# Patient Record
Sex: Male | Born: 1985 | Race: Asian | Hispanic: No | Marital: Single | Smoking: Current every day smoker
Health system: Southern US, Community
[De-identification: ages and names within clinical notes are randomized; demographics above are authoritative.]

## PROBLEM LIST (undated history)

## (undated) DIAGNOSIS — M25559 Pain in unspecified hip: Secondary | ICD-10-CM

---

## 2015-03-02 ENCOUNTER — Other Ambulatory Visit: Payer: Self-pay | Admitting: Orthopedic Surgery

## 2015-03-02 DIAGNOSIS — M87051 Idiopathic aseptic necrosis of right femur: Secondary | ICD-10-CM

## 2015-03-08 ENCOUNTER — Ambulatory Visit: Admission: RE | Admit: 2015-03-08 | Payer: BLUE CROSS/BLUE SHIELD | Source: Ambulatory Visit

## 2015-03-22 ENCOUNTER — Encounter
Admission: RE | Admit: 2015-03-22 | Discharge: 2015-03-22 | Disposition: A | Discharge status: Home / Self Care | Payer: BLUE CROSS/BLUE SHIELD | Source: Ambulatory Visit | Attending: Orthopedic Surgery | Admitting: Orthopedic Surgery

## 2015-03-22 DIAGNOSIS — Z01812 Encounter for preprocedural laboratory examination: Secondary | ICD-10-CM | POA: Insufficient documentation

## 2015-03-22 DIAGNOSIS — Z01810 Encounter for preprocedural cardiovascular examination: Secondary | ICD-10-CM | POA: Insufficient documentation

## 2015-03-22 HISTORY — DX: Pain in unspecified hip: M25.559

## 2015-03-22 LAB — URINALYSIS COMPLETE WITH MICROSCOPIC (ARMC ONLY)
Bacteria, UA: NONE SEEN
Bilirubin Urine: NEGATIVE
Glucose, UA: NEGATIVE mg/dL
Hgb urine dipstick: NEGATIVE
KETONES UR: NEGATIVE mg/dL
Leukocytes, UA: NEGATIVE
Nitrite: NEGATIVE
PH: 6 (ref 5.0–8.0)
Protein, ur: NEGATIVE mg/dL
RBC / HPF: NONE SEEN RBC/hpf (ref 0–5)
SQUAMOUS EPITHELIAL / LPF: NONE SEEN
Specific Gravity, Urine: 1.005 (ref 1.005–1.030)

## 2015-03-22 LAB — CBC
HCT: 50.2 % (ref 40.0–52.0)
Hemoglobin: 16.8 g/dL (ref 13.0–18.0)
MCH: 32.9 pg (ref 26.0–34.0)
MCHC: 33.5 g/dL (ref 32.0–36.0)
MCV: 98.2 fL (ref 80.0–100.0)
Platelets: 209 10*3/uL (ref 150–440)
RBC: 5.11 MIL/uL (ref 4.40–5.90)
RDW: 14.2 % (ref 11.5–14.5)
WBC: 9.1 10*3/uL (ref 3.8–10.6)

## 2015-03-22 LAB — BASIC METABOLIC PANEL
ANION GAP: 8 (ref 5–15)
BUN: 9 mg/dL (ref 6–20)
CALCIUM: 9.7 mg/dL (ref 8.9–10.3)
CO2: 27 mmol/L (ref 22–32)
Chloride: 103 mmol/L (ref 101–111)
Creatinine, Ser: 0.71 mg/dL (ref 0.61–1.24)
GLUCOSE: 96 mg/dL (ref 65–99)
Potassium: 4.2 mmol/L (ref 3.5–5.1)
Sodium: 138 mmol/L (ref 135–145)

## 2015-03-22 LAB — PROTIME-INR
INR: 0.89
Prothrombin Time: 12.2 seconds (ref 11.4–15.0)

## 2015-03-22 LAB — TYPE AND SCREEN
ABO/RH(D): B POS
Antibody Screen: NEGATIVE

## 2015-03-22 LAB — APTT: aPTT: 27 seconds (ref 24–36)

## 2015-03-22 LAB — SEDIMENTATION RATE: Sed Rate: 3 mm/hr (ref 0–15)

## 2015-03-22 LAB — SURGICAL PCR SCREEN
MRSA, PCR: NEGATIVE
Staphylococcus aureus: POSITIVE — AB

## 2015-03-22 NOTE — OR Nursing (Signed)
DR ADAMS REQUEST FOR CLEARANCE FOR HIGH BP CALLED AND FAXED TO DR [facility] OFFICE. PATIENT AWARE

## 2015-03-22 NOTE — Patient Instructions (Signed)
  Your procedure is scheduled on:03/29/15 Report to Day Surgery. [facility] SECOND FLOOR To find out your arrival time please call [phone] between 1PM - 3PM on 03/28/15  Remember: Instructions that are not followed completely may result in serious medical risk, up to and including death, or upon the discretion of your surgeon and anesthesiologist your surgery may need to be rescheduled.    __X__ 1. Do not eat food or drink liquids after midnight. No gum chewing or hard candies.     __X__ 2. No Alcohol for 24 hours before or after surgery.   ____ 3. Bring all medications with you on the day of surgery if instructed.    __X__ 4. Notify your doctor if there is any change in your medical condition     (cold, fever, infections).     Do not wear jewelry, make-up, hairpins, clips or nail polish.  Do not wear lotions, powders, or perfumes. You may wear deodorant.  Do not shave 48 hours prior to surgery. Men may shave face and neck.  Do not bring valuables to the hospital.    [facility] is not responsible for any belongings or valuables.               Contacts, dentures or bridgework may not be worn into surgery.  Leave your suitcase in the car. After surgery it may be brought to your room.  For patients admitted to the hospital, discharge time is determined by your                treatment team.   Patients discharged the day of surgery will not be allowed to drive home.   Please read over the following fact sheets that you were given:   Surgical Site Infection Prevention   ____ Take these medicines the morning of surgery with A SIP OF WATER:    1.   2.   3.   4.  5.  6.  ____ Fleet Enema (as directed)   _X___ Use CHG Soap as directed  ____ Use inhalers on the day of surgery  ____ Stop metformin 2 days prior to surgery    ____ Take 1/2 of usual insulin dose the night before surgery and none on the morning of surgery.   ____ Stop Coumadin/Plavix/aspirin on  ____ Stop  Anti-inflammatories on  ____ Stop supplements until after surgery.    ____ Bring C-Pap to the hospital.

## 2015-03-23 LAB — ABO/RH: ABO/RH(D): B POS

## 2015-03-27 NOTE — OR Nursing (Signed)
DR Burnadette Pop WOULD NOT CLEAR DUE TO UNCONTROLLED BP.[facility] AT [facility] AWARE

## 2015-03-27 NOTE — OR Nursing (Signed)
Positive staph called to Dr Rosita Kea office

## 2015-03-29 ENCOUNTER — Inpatient Hospital Stay
Admission: RE | Admit: 2015-03-29 | Payer: BLUE CROSS/BLUE SHIELD | Source: Ambulatory Visit | Admitting: Orthopedic Surgery

## 2015-03-29 ENCOUNTER — Encounter: Admission: RE | Payer: Self-pay | Source: Ambulatory Visit

## 2015-03-29 SURGERY — ARTHROPLASTY, HIP, TOTAL, ANTERIOR APPROACH
Anesthesia: Choice | Laterality: Right

## 2015-04-03 ENCOUNTER — Ambulatory Visit: Payer: BLUE CROSS/BLUE SHIELD

## 2015-04-03 ENCOUNTER — Ambulatory Visit: Payer: BLUE CROSS/BLUE SHIELD | Admitting: Anesthesiology

## 2015-04-03 ENCOUNTER — Encounter: Payer: Self-pay | Admitting: *Deleted

## 2015-04-03 ENCOUNTER — Inpatient Hospital Stay: Payer: BLUE CROSS/BLUE SHIELD

## 2015-04-03 ENCOUNTER — Encounter
Admission: RE | Disposition: A | Discharge status: Home / Self Care | Payer: Self-pay | Source: Ambulatory Visit | Attending: Orthopedic Surgery

## 2015-04-03 ENCOUNTER — Inpatient Hospital Stay
Admission: RE | Admit: 2015-04-03 | Discharge: 2015-04-06 | DRG: 470 | Disposition: A | Discharge status: Home / Self Care | Payer: BLUE CROSS/BLUE SHIELD | Source: Ambulatory Visit | Attending: Orthopedic Surgery | Admitting: Orthopedic Surgery

## 2015-04-03 DIAGNOSIS — Z419 Encounter for procedure for purposes other than remedying health state, unspecified: Secondary | ICD-10-CM

## 2015-04-03 DIAGNOSIS — G8918 Other acute postprocedural pain: Secondary | ICD-10-CM

## 2015-04-03 DIAGNOSIS — M879 Osteonecrosis, unspecified: Principal | ICD-10-CM | POA: Diagnosis present

## 2015-04-03 DIAGNOSIS — Z79899 Other long term (current) drug therapy: Secondary | ICD-10-CM

## 2015-04-03 DIAGNOSIS — Z79891 Long term (current) use of opiate analgesic: Secondary | ICD-10-CM

## 2015-04-03 DIAGNOSIS — Z7982 Long term (current) use of aspirin: Secondary | ICD-10-CM

## 2015-04-03 DIAGNOSIS — I10 Essential (primary) hypertension: Secondary | ICD-10-CM | POA: Diagnosis present

## 2015-04-03 HISTORY — PX: TOTAL HIP ARTHROPLASTY: SHX124

## 2015-04-03 LAB — CBC
HEMATOCRIT: 47.1 % (ref 40.0–52.0)
HEMOGLOBIN: 15.4 g/dL (ref 13.0–18.0)
MCH: 31.9 pg (ref 26.0–34.0)
MCHC: 32.7 g/dL (ref 32.0–36.0)
MCV: 97.5 fL (ref 80.0–100.0)
Platelets: 238 10*3/uL (ref 150–440)
RBC: 4.83 MIL/uL (ref 4.40–5.90)
RDW: 13.7 % (ref 11.5–14.5)
WBC: 13.9 10*3/uL — AB (ref 3.8–10.6)

## 2015-04-03 LAB — CREATININE, SERUM
Creatinine, Ser: 0.83 mg/dL (ref 0.61–1.24)
GFR calc non Af Amer: >60 mL/min (ref >60)

## 2015-04-03 SURGERY — ARTHROPLASTY, HIP, TOTAL, ANTERIOR APPROACH
Anesthesia: General | Site: Hip | Laterality: Right | Wound class: Clean

## 2015-04-03 MED ORDER — METOCLOPRAMIDE HCL 5 MG/ML IJ SOLN: 5.0000 mg | Freq: Three times a day (TID) | INTRAMUSCULAR | Status: DC | PRN

## 2015-04-03 MED ORDER — LACTATED RINGERS IV SOLN
INTRAVENOUS | Status: DC
  Administered 2015-04-03 (×2): via INTRAVENOUS

## 2015-04-03 MED ORDER — FAMOTIDINE 20 MG PO TABS
ORAL_TABLET | ORAL | Status: AC
  Filled 2015-04-03: qty 1

## 2015-04-03 MED ORDER — CLINDAMYCIN PHOSPHATE 600 MG/50ML IV SOLN
600.0000 mg | Freq: Four times a day (QID) | INTRAVENOUS | Status: AC
  Administered 2015-04-03 (×2): 600 mg via INTRAVENOUS
  Filled 2015-04-03 (×2): qty 50

## 2015-04-03 MED ORDER — DOCUSATE SODIUM 100 MG PO CAPS
100.0000 mg | ORAL_CAPSULE | Freq: Two times a day (BID) | ORAL | Status: DC
  Administered 2015-04-03 – 2015-04-06 (×6): 100 mg via ORAL
  Filled 2015-04-03 (×7): qty 1

## 2015-04-03 MED ORDER — MORPHINE SULFATE (PF) 2 MG/ML IV SOLN
2.0000 mg | INTRAVENOUS | Status: DC | PRN
  Administered 2015-04-03: 2 mg via INTRAVENOUS
  Filled 2015-04-03: qty 1

## 2015-04-03 MED ORDER — ONDANSETRON HCL 4 MG/2ML IJ SOLN
INTRAMUSCULAR | Status: DC | PRN
  Administered 2015-04-03: 4 mg via INTRAVENOUS

## 2015-04-03 MED ORDER — HYDROMORPHONE HCL 1 MG/ML IJ SOLN
0.2500 mg | INTRAMUSCULAR | Status: DC | PRN
  Administered 2015-04-03 (×8): 0.25 mg via INTRAVENOUS

## 2015-04-03 MED ORDER — SUCCINYLCHOLINE CHLORIDE 20 MG/ML IJ SOLN
INTRAMUSCULAR | Status: DC | PRN
  Administered 2015-04-03: 120 mg via INTRAVENOUS

## 2015-04-03 MED ORDER — SODIUM CHLORIDE 0.9 % IV SOLN
INTRAVENOUS | Status: DC
  Administered 2015-04-03 – 2015-04-04 (×2): via INTRAVENOUS

## 2015-04-03 MED ORDER — HYDROMORPHONE HCL 1 MG/ML IJ SOLN
0.2500 mg | INTRAMUSCULAR | Status: DC | PRN
  Administered 2015-04-03 (×3): 0.5 mg via INTRAVENOUS

## 2015-04-03 MED ORDER — TRANEXAMIC ACID 1000 MG/10ML IV SOLN
1000.0000 mg | INTRAVENOUS | Status: AC
  Administered 2015-04-03: 1000 mg via INTRAVENOUS
  Filled 2015-04-03: qty 10

## 2015-04-03 MED ORDER — MAGNESIUM HYDROXIDE 400 MG/5ML PO SUSP: 30.0000 mL | Freq: Every day | ORAL | Status: DC | PRN

## 2015-04-03 MED ORDER — ACETAMINOPHEN 325 MG PO TABS: 650.0000 mg | ORAL_TABLET | Freq: Four times a day (QID) | ORAL | Status: DC | PRN

## 2015-04-03 MED ORDER — BUPIVACAINE-EPINEPHRINE 0.25% -1:200000 IJ SOLN
INTRAMUSCULAR | Status: DC | PRN
  Administered 2015-04-03: 30 mL

## 2015-04-03 MED ORDER — METOCLOPRAMIDE HCL 5 MG PO TABS: 5.0000 mg | ORAL_TABLET | Freq: Three times a day (TID) | ORAL | Status: DC | PRN

## 2015-04-03 MED ORDER — CLINDAMYCIN PHOSPHATE 900 MG/50ML IV SOLN
INTRAVENOUS | Status: AC
  Administered 2015-04-03: 900 mg via INTRAVENOUS
  Filled 2015-04-03: qty 50

## 2015-04-03 MED ORDER — ALUM & MAG HYDROXIDE-SIMETH 200-200-20 MG/5ML PO SUSP: 30.0000 mL | ORAL | Status: DC | PRN

## 2015-04-03 MED ORDER — METHOCARBAMOL 500 MG PO TABS
500.0000 mg | ORAL_TABLET | Freq: Four times a day (QID) | ORAL | Status: DC | PRN
  Administered 2015-04-05: 500 mg via ORAL
  Filled 2015-04-03: qty 1

## 2015-04-03 MED ORDER — ACETAMINOPHEN 10 MG/ML IV SOLN
INTRAVENOUS | Status: AC
Start: 2015-04-03 — End: 2015-04-03
  Filled 2015-04-03: qty 100

## 2015-04-03 MED ORDER — BISACODYL 10 MG RE SUPP: 10.0000 mg | Freq: Every day | RECTAL | Status: DC | PRN

## 2015-04-03 MED ORDER — HYDROMORPHONE HCL 1 MG/ML IJ SOLN
INTRAMUSCULAR | Status: AC
  Administered 2015-04-03: 0.25 mg via INTRAVENOUS
  Filled 2015-04-03: qty 1

## 2015-04-03 MED ORDER — HYDROMORPHONE HCL 1 MG/ML IJ SOLN
INTRAMUSCULAR | Status: AC
  Administered 2015-04-03: 0.5 mg via INTRAVENOUS
  Filled 2015-04-03: qty 1

## 2015-04-03 MED ORDER — NEOMYCIN-POLYMYXIN B GU 40-200000 IR SOLN
Status: DC | PRN
  Administered 2015-04-03: 4 mL

## 2015-04-03 MED ORDER — METHOCARBAMOL 1000 MG/10ML IJ SOLN: 500.0000 mg | Freq: Four times a day (QID) | INTRAMUSCULAR | Status: DC | PRN

## 2015-04-03 MED ORDER — LIDOCAINE HCL (CARDIAC) 20 MG/ML IV SOLN
INTRAVENOUS | Status: DC | PRN
  Administered 2015-04-03: 100 mg via INTRAVENOUS

## 2015-04-03 MED ORDER — DEXAMETHASONE SODIUM PHOSPHATE 4 MG/ML IJ SOLN
INTRAMUSCULAR | Status: DC | PRN
  Administered 2015-04-03: 10 mg via INTRAVENOUS

## 2015-04-03 MED ORDER — NEOMYCIN-POLYMYXIN B GU 40-200000 IR SOLN
Status: AC
  Filled 2015-04-03: qty 20

## 2015-04-03 MED ORDER — FENTANYL CITRATE (PF) 100 MCG/2ML IJ SOLN
25.0000 ug | INTRAMUSCULAR | Status: DC | PRN
  Administered 2015-04-03 (×4): 25 ug via INTRAVENOUS

## 2015-04-03 MED ORDER — LISINOPRIL 10 MG PO TABS
10.0000 mg | ORAL_TABLET | Freq: Every day | ORAL | Status: DC
  Administered 2015-04-04 – 2015-04-06 (×3): 10 mg via ORAL
  Filled 2015-04-03 (×3): qty 1

## 2015-04-03 MED ORDER — CLINDAMYCIN PHOSPHATE 900 MG/50ML IV SOLN: 900.0000 mg | INTRAVENOUS | Status: DC

## 2015-04-03 MED ORDER — ONDANSETRON HCL 4 MG PO TABS: 4.0000 mg | ORAL_TABLET | Freq: Four times a day (QID) | ORAL | Status: DC | PRN

## 2015-04-03 MED ORDER — CHLORHEXIDINE GLUCONATE 4 % EX LIQD: 60.0000 mL | Freq: Once | CUTANEOUS | Status: DC

## 2015-04-03 MED ORDER — HYDROCHLOROTHIAZIDE 12.5 MG PO CAPS
12.5000 mg | ORAL_CAPSULE | Freq: Every day | ORAL | Status: DC
  Administered 2015-04-04 – 2015-04-06 (×3): 12.5 mg via ORAL
  Filled 2015-04-03 (×3): qty 1

## 2015-04-03 MED ORDER — ACETAMINOPHEN 10 MG/ML IV SOLN
INTRAVENOUS | Status: DC | PRN
  Administered 2015-04-03: 1000 mg via INTRAVENOUS

## 2015-04-03 MED ORDER — ACETAMINOPHEN 650 MG RE SUPP: 650.0000 mg | Freq: Four times a day (QID) | RECTAL | Status: DC | PRN

## 2015-04-03 MED ORDER — ONDANSETRON HCL 4 MG/2ML IJ SOLN: 4.0000 mg | Freq: Once | INTRAMUSCULAR | Status: DC | PRN

## 2015-04-03 MED ORDER — PHENOL 1.4 % MT LIQD: 1.0000 | OROMUCOSAL | Status: DC | PRN

## 2015-04-03 MED ORDER — MAGNESIUM CITRATE PO SOLN: 1.0000 | Freq: Once | ORAL | Status: DC | PRN

## 2015-04-03 MED ORDER — DIPHENHYDRAMINE HCL 12.5 MG/5ML PO ELIX: 12.5000 mg | ORAL_SOLUTION | ORAL | Status: DC | PRN

## 2015-04-03 MED ORDER — HYDROMORPHONE HCL 1 MG/ML IJ SOLN
0.5000 mg | INTRAMUSCULAR | Status: DC | PRN
  Administered 2015-04-03 (×2): 0.5 mg via INTRAVENOUS
  Filled 2015-04-03 (×2): qty 1

## 2015-04-03 MED ORDER — KETOROLAC TROMETHAMINE 30 MG/ML IJ SOLN
INTRAMUSCULAR | Status: DC | PRN
  Administered 2015-04-03: 30 mg via INTRAVENOUS

## 2015-04-03 MED ORDER — FENTANYL CITRATE (PF) 100 MCG/2ML IJ SOLN
INTRAMUSCULAR | Status: AC
Start: 2015-04-03 — End: 2015-04-03
  Administered 2015-04-03: 25 ug via INTRAVENOUS
  Filled 2015-04-03: qty 2

## 2015-04-03 MED ORDER — PROPOFOL 10 MG/ML IV BOLUS
INTRAVENOUS | Status: DC | PRN
  Administered 2015-04-03: 200 mg via INTRAVENOUS

## 2015-04-03 MED ORDER — MENTHOL 3 MG MT LOZG: 1.0000 | LOZENGE | OROMUCOSAL | Status: DC | PRN

## 2015-04-03 MED ORDER — LABETALOL HCL 5 MG/ML IV SOLN
INTRAVENOUS | Status: AC
  Administered 2015-04-03: 5 mg via INTRAVENOUS
  Filled 2015-04-03: qty 4

## 2015-04-03 MED ORDER — ONDANSETRON HCL 4 MG/2ML IJ SOLN
4.0000 mg | Freq: Four times a day (QID) | INTRAMUSCULAR | Status: DC | PRN
  Administered 2015-04-04: 4 mg via INTRAVENOUS
  Filled 2015-04-03: qty 2

## 2015-04-03 MED ORDER — MIDAZOLAM HCL 2 MG/2ML IJ SOLN
INTRAMUSCULAR | Status: DC | PRN
  Administered 2015-04-03: 2 mg via INTRAVENOUS

## 2015-04-03 MED ORDER — ROCURONIUM BROMIDE 100 MG/10ML IV SOLN
INTRAVENOUS | Status: DC | PRN
  Administered 2015-04-03: 40 mg via INTRAVENOUS
  Administered 2015-04-03: 10 mg via INTRAVENOUS

## 2015-04-03 MED ORDER — OXYCODONE HCL 5 MG PO TABS
5.0000 mg | ORAL_TABLET | ORAL | Status: DC | PRN
  Administered 2015-04-03 (×2): 5 mg via ORAL
  Administered 2015-04-03 – 2015-04-06 (×14): 10 mg via ORAL
  Filled 2015-04-03: qty 1
  Filled 2015-04-03: qty 10
  Filled 2015-04-03 (×10): qty 2
  Filled 2015-04-03: qty 1
  Filled 2015-04-03 (×3): qty 2

## 2015-04-03 MED ORDER — FAMOTIDINE 20 MG PO TABS
20.0000 mg | ORAL_TABLET | Freq: Once | ORAL | Status: AC
  Administered 2015-04-03: 20 mg via ORAL

## 2015-04-03 MED ORDER — BUPIVACAINE-EPINEPHRINE (PF) 0.25% -1:200000 IJ SOLN
INTRAMUSCULAR | Status: AC
  Filled 2015-04-03: qty 30

## 2015-04-03 MED ORDER — HYDROMORPHONE HCL 1 MG/ML IJ SOLN
1.0000 mg | INTRAMUSCULAR | Status: DC | PRN
  Administered 2015-04-03 – 2015-04-06 (×12): 1 mg via INTRAVENOUS
  Filled 2015-04-03 (×12): qty 1

## 2015-04-03 MED ORDER — SUGAMMADEX SODIUM 200 MG/2ML IV SOLN
INTRAVENOUS | Status: DC | PRN
  Administered 2015-04-03: 177 mg via INTRAVENOUS

## 2015-04-03 MED ORDER — ZOLPIDEM TARTRATE 5 MG PO TABS: 5.0000 mg | ORAL_TABLET | Freq: Every evening | ORAL | Status: DC | PRN

## 2015-04-03 MED ORDER — LISINOPRIL-HYDROCHLOROTHIAZIDE 10-12.5 MG PO TABS: 1.0000 | ORAL_TABLET | Freq: Every day | ORAL | Status: DC

## 2015-04-03 MED ORDER — ENOXAPARIN SODIUM 40 MG/0.4ML ~~LOC~~ SOLN
40.0000 mg | SUBCUTANEOUS | Status: DC
  Administered 2015-04-04 – 2015-04-06 (×3): 40 mg via SUBCUTANEOUS
  Filled 2015-04-03 (×3): qty 0.4

## 2015-04-03 MED ORDER — FENTANYL CITRATE (PF) 100 MCG/2ML IJ SOLN
INTRAMUSCULAR | Status: DC | PRN
  Administered 2015-04-03: 100 ug via INTRAVENOUS
  Administered 2015-04-03 (×2): 50 ug via INTRAVENOUS
  Administered 2015-04-03: 100 ug via INTRAVENOUS

## 2015-04-03 MED ORDER — LABETALOL HCL 5 MG/ML IV SOLN
5.0000 mg | INTRAVENOUS | Status: DC | PRN
  Administered 2015-04-03 (×2): 5 mg via INTRAVENOUS

## 2015-04-03 SURGICAL SUPPLY — 43 items
BLADE SAW 1/2 (BLADE) ×3 IMPLANT
BNDG COHESIVE 6X5 TAN STRL LF (GAUZE/BANDAGES/DRESSINGS) ×6 IMPLANT
CANISTER SUCT 1200ML W/VALVE (MISCELLANEOUS) ×3 IMPLANT
CAPT HIP TOTAL 3 ×3 IMPLANT
CATH FOL LEG HOLDER (MISCELLANEOUS) ×3 IMPLANT
CATH TRAY METER 16FR LF (MISCELLANEOUS) ×3 IMPLANT
CHLORAPREP W/TINT 26ML (MISCELLANEOUS) ×3 IMPLANT
DRAPE C-ARM XRAY 36X54 (DRAPES) ×3 IMPLANT
DRAPE INCISE IOBAN 66X60 STRL (DRAPES) IMPLANT
DRAPE POUCH INSTRU U-SHP 10X18 (DRAPES) ×3 IMPLANT
DRAPE SHEET LG 3/4 BI-LAMINATE (DRAPES) ×9 IMPLANT
DRAPE TABLE BACK 80X90 (DRAPES) ×3 IMPLANT
ELECT BLADE 6.5 EXT (BLADE) ×3 IMPLANT
GAUZE SPONGE 4X4 12PLY STRL (GAUZE/BANDAGES/DRESSINGS) ×3 IMPLANT
GLOVE BIOGEL PI IND STRL 9 (GLOVE) ×1 IMPLANT
GLOVE BIOGEL PI INDICATOR 9 (GLOVE) ×2
GLOVE SURG ORTHO 9.0 STRL STRW (GLOVE) ×3 IMPLANT
GOWN SPECIALTY ULTRA XL (MISCELLANEOUS) ×3 IMPLANT
GOWN STRL REUS W/ TWL LRG LVL3 (GOWN DISPOSABLE) ×1 IMPLANT
GOWN STRL REUS W/TWL LRG LVL3 (GOWN DISPOSABLE) ×2
HEMOVAC 400CC 10FR (MISCELLANEOUS) ×3 IMPLANT
HOOD PEEL AWAY FACE SHEILD DIS (HOOD) ×3 IMPLANT
MAT BLUE FLOOR 46X72 FLO (MISCELLANEOUS) ×3 IMPLANT
NDL SAFETY 18GX1.5 (NEEDLE) ×3 IMPLANT
NEEDLE SPNL 18GX3.5 QUINCKE PK (NEEDLE) ×3 IMPLANT
NS IRRIG 1000ML POUR BTL (IV SOLUTION) ×3 IMPLANT
PACK HIP COMPR (MISCELLANEOUS) ×3 IMPLANT
SOL PREP PVP 2OZ (MISCELLANEOUS) ×3
SOLUTION PREP PVP 2OZ (MISCELLANEOUS) ×1 IMPLANT
STAPLER SKIN PROX 35W (STAPLE) ×3 IMPLANT
STRAP SAFETY BODY (MISCELLANEOUS) ×3 IMPLANT
SUT DVC 2 QUILL PDO  T11 36X36 (SUTURE) ×2
SUT DVC 2 QUILL PDO T11 36X36 (SUTURE) ×1 IMPLANT
SUT DVC QUILL MONODERM 30X30 (SUTURE) ×3 IMPLANT
SUT ETHIBOND NAB CT1 #1 30IN (SUTURE) ×3 IMPLANT
SUT SILK 0 (SUTURE) ×2
SUT SILK 0 30XBRD TIE 6 (SUTURE) ×1 IMPLANT
SUT VIC AB 1 CT1 36 (SUTURE) ×3 IMPLANT
SYR 20CC LL (SYRINGE) ×3 IMPLANT
SYR 30ML LL (SYRINGE) ×3 IMPLANT
TAPE MICROFOAM 4IN (TAPE) ×3 IMPLANT
TUBE KAMVAC SUCTION (TUBING) ×3 IMPLANT
WATER STERILE IRR 1000ML POUR (IV SOLUTION) IMPLANT

## 2015-04-03 NOTE — Progress Notes (Signed)
Dilaudid changed to  q1h prn and oxycodone 5-10mg  q4h prn for moderate pain.

## 2015-04-03 NOTE — H&P (Signed)
Reviewed paper H+P, will be scanned into chart. No changes noted.  

## 2015-04-03 NOTE — Op Note (Signed)
04/03/2015  9:42 AM  PATIENT:  Edward Hampton  29 y.o. male  PRE-OPERATIVE DIAGNOSIS:  RIGHT AVASCULAR NECROSIS  POST-OPERATIVE DIAGNOSIS:  RIGHT AVASCULAR NECROSIS  PROCEDURE:  Procedure(s): TOTAL HIP ARTHROPLASTY ANTERIOR APPROACH (Right)  SURGEON: Leitha Schuller, MD  ASSISTANTS: None  ANESTHESIA:   general  EBL:  Total I/O In: 1100 [I.V.:1100] Out: 450 [Urine:300; Blood:150]  BLOOD ADMINISTERED:none  DRAINS: none   LOCAL MEDICATIONS USED:  MARCAINE     SPECIMEN:  Source of Specimen:  Femoral head  DISPOSITION OF SPECIMEN:  PATHOLOGY  COUNTS:  YES  TOURNIQUET:  * No tourniquets in log *  IMPLANTS: Medacta AMIS 3 standard stem, 58 mm Mpact DM cup and liner, M 28 mm head  DICTATION: .Dragon Dictation   The patient was brought to the operating room and after general anesthesia was obtained patient was placed on the operative table with the ipsilateral foot into the Medacta attachment, contralateral leg on a well-padded table. C-arm was brought in and preop template x-ray taken. After prepping and draping in usual sterile fashion appropriate patient identification and timeout procedures were completed. Anterior approach to the hip was obtained and centered over the greater trochanter and TFL muscle. The subcutaneous tissue was incised hemostasis being achieved by electrocautery. TFL fascia was incised and the muscle retracted laterally deep retractor placed. The lateral femoral circumflex vessels were identified and ligated. The anterior capsule was exposed and a capsulotomy performed. The neck was identified and a femoral neck cut carried out with a saw. The head was removed without difficulty and showed sclerotic femoral head and apparent avascular necrosis. Acetabulum had synovitis present. Reaming was carried out to 56 mm and a 58mm Mpact cup trial gave appropriate tightness to the acetabular component a 58 cup was impacted into position. The leg was then externally rotated and  ischiofemoral and patellofemoral releases carried out. The femur was sequentially broached to a size 3, size 3 standard and S 28 head trials were placed and the final components chosen. The 3 standard stem was inserted along with a M 28 mm head and 58 mm liner. The hip was reduced and was stable the wound was thoroughly irrigated with a dilute Betadine solution. The deep fascia view. Using a heavy Quill after infiltration of 30 cc of quarter percent Sensorcaine with epinephrine. 2-0 Quill to close the skin with skin staples Xeroform 4 x 4's ABDs and tape patient was sent to recovery in stable condition complications none specimen removed femoral head issue comes stable.    PLAN OF CARE: Admit to inpatient

## 2015-04-03 NOTE — Progress Notes (Signed)
Clindamycin and TXA sent to OR with patient Thermal cap in place Sacral drsg sent to OR with patient

## 2015-04-03 NOTE — Progress Notes (Signed)
Morphine not helping pts. Pain. Dilaudid 0.5mg  q1h prn ordered per Dr. Rosita Kea.

## 2015-04-03 NOTE — Progress Notes (Signed)
Dr. Rosita Kea ordered NS at 125cc/hr

## 2015-04-03 NOTE — Transfer of Care (Signed)
Immediate Anesthesia Transfer of Care Note  Patient: Edward Hampton  Procedure(s) Performed: Procedure(s): TOTAL HIP ARTHROPLASTY ANTERIOR APPROACH (Right)  Patient Location: PACU  Anesthesia Type:General  Level of Consciousness: awake and sedated  Airway & Oxygen Therapy: Patient Spontanous Breathing and Patient connected to face mask oxygen  Post-op Assessment: Report given to RN and Post -op Vital signs reviewed and stable  Post vital signs: Reviewed and stable  Last Vitals:  Filed Vitals:   04/03/15 0922  BP: 121/82  Pulse: 85  Temp: 36.3 C  Resp: 24    Complications: No apparent anesthesia complications

## 2015-04-03 NOTE — Anesthesia Procedure Notes (Signed)
Procedure Name: Intubation Date/Time: 04/03/2015 8:25 AM Performed by: Junious Silk Pre-anesthesia Checklist: Patient identified, Patient being monitored, Timeout performed, Emergency Drugs available and Suction available Patient Re-evaluated:Patient Re-evaluated prior to inductionOxygen Delivery Method: Circle system utilized Preoxygenation: Pre-oxygenation with 100% oxygen Intubation Type: IV induction Ventilation: Mask ventilation without difficulty Laryngoscope Size: Mac and 3 Grade View: Grade I Tube type: Oral Tube size: 7.5 mm Number of attempts: 1 Airway Equipment and Method: Stylet Placement Confirmation: ETT inserted through vocal cords under direct vision,  positive ETCO2 and breath sounds checked- equal and bilateral Secured at: 21 cm Tube secured with: Tape Dental Injury: Teeth and Oropharynx as per pre-operative assessment

## 2015-04-03 NOTE — Anesthesia Preprocedure Evaluation (Addendum)
Anesthesia Evaluation  Patient identified by MRN, date of birth, ID band Patient awake    Reviewed: Allergy & Precautions, H&P , NPO status , Patient's Chart, lab work & pertinent test results, reviewed documented beta blocker date and time   Airway Mallampati: II  TM Distance: >3 FB Neck ROM: full    Dental  (+) Teeth Intact   Pulmonary Current Smoker,          Cardiovascular hypertension, Rhythm:regular Rate:Normal     Neuro/Psych    GI/Hepatic   Endo/Other    Renal/GU      Musculoskeletal   Abdominal   Peds  Hematology   Anesthesia Other Findings   Reproductive/Obstetrics                            Anesthesia Physical Anesthesia Plan  ASA: II  Anesthesia Plan: General ETT   Post-op Pain Management:    Induction:   Airway Management Planned:   Additional Equipment:   Intra-op Plan:   Post-operative Plan:   Informed Consent: I have reviewed the patients History and Physical, chart, labs and discussed the procedure including the risks, benefits and alternatives for the proposed anesthesia with the patient or authorized representative who has indicated his/her understanding and acceptance.     Plan Discussed with: CRNA  Anesthesia Plan Comments:         Anesthesia Quick Evaluation

## 2015-04-03 NOTE — Progress Notes (Signed)
Pt. Alert and oriented. POD today. Vss. Pain controlled with meds per MAR. Foley patent. Tolerating PO's. Neurochecks WDL. Resting quietly.

## 2015-04-03 NOTE — Anesthesia Postprocedure Evaluation (Signed)
  Anesthesia Post-op Note  Patient: Edward Hampton  Procedure(s) Performed: Procedure(s): TOTAL HIP ARTHROPLASTY ANTERIOR APPROACH (Right)  Anesthesia type:General ETT  Patient location: PACU  Post pain: Pain level controlled  Post assessment: Post-op Vital signs reviewed, Patient's Cardiovascular Status Stable, Respiratory Function Stable, Patent Airway and No signs of Nausea or vomiting  Post vital signs: Reviewed and stable  Last Vitals:  Filed Vitals:   04/03/15 1215  BP: 140/79  Pulse: 83  Temp: 36.5 C  Resp: 18    Level of consciousness: awake, alert  and patient cooperative  Complications: No apparent anesthesia complications

## 2015-04-03 NOTE — Progress Notes (Signed)
Discussed with patient that HIV is risk factor for AVN and would like to check him for it, test ordered for am.

## 2015-04-04 ENCOUNTER — Encounter: Payer: Self-pay | Admitting: Orthopedic Surgery

## 2015-04-04 LAB — BASIC METABOLIC PANEL
ANION GAP: 7 (ref 5–15)
BUN: 11 mg/dL (ref 6–20)
CALCIUM: 9.1 mg/dL (ref 8.9–10.3)
CHLORIDE: 102 mmol/L (ref 101–111)
CO2: 31 mmol/L (ref 22–32)
Creatinine, Ser: 0.82 mg/dL (ref 0.61–1.24)
GFR calc non Af Amer: >60 mL/min (ref >60)
Glucose, Bld: 113 mg/dL — ABNORMAL HIGH (ref 65–99)
POTASSIUM: 4.4 mmol/L (ref 3.5–5.1)
Sodium: 140 mmol/L (ref 135–145)

## 2015-04-04 LAB — RAPID HIV SCREEN (HIV 1/2 AB+AG)
HIV 1/2 ANTIBODIES: NONREACTIVE
HIV-1 P24 ANTIGEN - HIV24: NONREACTIVE

## 2015-04-04 LAB — CBC
HEMATOCRIT: 44 % (ref 40.0–52.0)
HEMOGLOBIN: 14.5 g/dL (ref 13.0–18.0)
MCH: 32.1 pg (ref 26.0–34.0)
MCHC: 33.1 g/dL (ref 32.0–36.0)
MCV: 97.2 fL (ref 80.0–100.0)
Platelets: 218 10*3/uL (ref 150–440)
RBC: 4.52 MIL/uL (ref 4.40–5.90)
RDW: 13.8 % (ref 11.5–14.5)
WBC: 11.6 10*3/uL — AB (ref 3.8–10.6)

## 2015-04-04 NOTE — Progress Notes (Signed)
Clinical Social Worker (CSW) received SNF consult. PT is recommending home health. RN Case Manager aware of above. Please reconsult if future social work needs arise. CSW signing off.   Jousha Schwandt Morgan, LCSWA [phone] 

## 2015-04-04 NOTE — Evaluation (Signed)
Physical Therapy Evaluation Patient Details Name: Edward Hampton MRN: [id number] DOB: August 05, 1986 Today's Date: 04/04/2015   History of Present Illness  Pt is a 29 y.o. 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  PMH:  htn.  Clinical Impression  Currently pt demonstrates impairments with strength, balance, pain, and limitations with functional mobility.  Prior to admission, pt was independent using 1-2 axillary crutches to ambulate depending on the day.  Pt lives with 2 roommates in a 1 level home with 5 steps to enter B railings.  Currently pt is min assist with supine to sit; CGA with transfers with RW, and CGA with ambulation 50 feet with RW.  With gait, pt with intermittent difficulty advancing R LE/clearing R LE from floor; occasional mild R knee buckling noted during stance phase but pt able to self correct with UE support on RW. Pt would benefit from skilled PT to address above noted impairments and functional limitations.  Recommend pt discharge to home with support of friends and HHPT when medically appropriate.     Follow Up Recommendations Home health PT    Equipment Recommendations   (RW vs axillary crutches pending pt's progress)    Recommendations for Other Services       Precautions / Restrictions Precautions Precautions: Anterior Hip Precaution Booklet Issued: Yes (comment) Restrictions Weight Bearing Restrictions: Yes RLE Weight Bearing: Weight bearing as tolerated      Mobility  Bed Mobility Overal bed mobility: Needs Assistance Bed Mobility: Supine to Sit     Supine to sit: Min assist;HOB elevated     General bed mobility comments: assist for R LE  Transfers Overall transfer level: Needs assistance Equipment used: Rolling walker (2 wheeled) Transfers: Sit to/from Stand Sit to Stand: Min guard         General transfer comment: vc's required for hand and feet placement and safe technique with  RW  Ambulation/Gait Ambulation/Gait assistance: Min guard Ambulation Distance (Feet): 50 Feet Assistive device: Rolling walker (2 wheeled)   Gait velocity: decreased   General Gait Details: Pt with intermittent difficulty advancing R LE/clearing R LE from floor; occasional mild R knee buckling noted during stance phase but pt able to self correct with UE support on RW; vc's required for gait technique and use of RW; step to gait pattern  Stairs            Wheelchair Mobility    Modified Rankin (Stroke Patients Only)       Balance Overall balance assessment: Needs assistance Sitting-balance support: No upper extremity supported;Feet supported Sitting balance-Leahy Scale: Normal     Standing balance support: Bilateral upper extremity supported Standing balance-Leahy Scale: Good                               Pertinent Vitals/Pain Pain Assessment: 0-10 Pain Score: 5  Pain Location: R hip Pain Descriptors / Indicators: Aching;Sore;Tender Pain Intervention(s): Limited activity within patient's tolerance;Monitored during session;Premedicated before session;Repositioned  See flowsheet for details.    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        Extremity/Trunk Assessment   Upper Extremity Assessment: Overall WFL for tasks assessed           Lower Extremity  Assessment: RLE deficits/detail;LLE deficits/detail RLE Deficits / Details: R hip flexion 2+/5; R knee extension at least 3+/5; R DF at least 4/5 LLE Deficits / Details: WFL     Communication   Communication: No difficulties  Cognition Arousal/Alertness: Awake/alert Behavior During Therapy: WFL for tasks assessed/performed Overall Cognitive Status: Within Functional Limits for tasks assessed                      General Comments General comments (skin integrity, edema, etc.): dressings intact  Nursing cleared pt for participation in physical therapy.  Pt agreeable to PT session.    Exercises   Performed x10 B LE semi-supine therapeutic exercise:  Quad sets with 3 second holds (AROM R; AROM L); hip Adduction isometrics x3 seconds (pillow between thighs) (AROM B LE's); ankle pumps (AROM B LE's); gluteal sets x3 second holds (AROM B); heelslides (AAROM R; AROM L); SAQ's (AROM R; AROM L); hip abduction/adduction (AAROM R; AROM L); and SLR (AAROM R; AROM L).  Pt required vc's and tactile cues for correct technique with ex's.      Assessment/Plan    PT Assessment Patient needs continued PT services  PT Diagnosis Difficulty walking   PT Problem List Decreased strength;Decreased activity tolerance;Decreased balance;Decreased mobility;Decreased knowledge of use of DME;Decreased safety awareness;Decreased knowledge of precautions;Pain  PT Treatment Interventions DME instruction;Gait training;Stair training;Functional mobility training;Therapeutic activities;Therapeutic exercise;Balance training;Patient/family education   PT Goals (Current goals can be found in the Care Plan section) Acute Rehab PT Goals Patient Stated Goal: To go home PT Goal Formulation: With patient Time For Goal Achievement: 04/18/15 Potential to Achieve Goals: Good    Frequency BID   Barriers to discharge        Co-evaluation               End of Session Equipment Utilized During Treatment: Gait  belt Activity Tolerance: Patient tolerated treatment well Patient left: in chair;with call bell/phone within reach;with chair alarm set;with SCD's reapplied Nurse Communication: Mobility status         Time: 1610-9604 PT Time Calculation (min) (ACUTE ONLY): 43 min   Charges:   PT Evaluation $Initial PT Evaluation Tier I: 1 Procedure PT Treatments $Therapeutic Exercise: 8-22 mins   PT G CodesHendricks Limes 2015-04-12, 11:02 AM Hendricks Limes, PT [phone]

## 2015-04-04 NOTE — Progress Notes (Signed)
Physical Therapy Treatment Patient Details Name: Edward Hampton MRN: [id number] DOB: [birth year] Today's Date: 04/04/2015    History of Present Illness Pt is a 29 y.o. 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  PMH:  htn.    PT Comments    Pt able to progress ambulation distance to 140 feet with RW CGA this afternoon.  Pt initially with difficulty advancing/clearing R LE from floor but improved with distance; pt also able to self correct mild R knee flexion (during stance phase) noted with ambulation with vc's for gait technique (knee flexion appears more d/t pt's prior "limping" gait pattern than weakness).  Anticipate with continued progress pt will be able to discharge home with HHPT.  Follow Up Recommendations  Home health PT     Equipment Recommendations   (RW vs axillary crutches pending pt's progress)    Recommendations for Other Services       Precautions / Restrictions Precautions Precautions: Anterior Hip Precaution Booklet Issued: Yes (comment) Restrictions Weight Bearing Restrictions: Yes RLE Weight Bearing: Weight bearing as tolerated    Mobility  Bed Mobility          General bed mobility comments: Not assessed in afternoon d/t pt in chair beginning of session and wanting to be in chair end of session  Transfers Overall transfer level: Needs assistance Equipment used: Rolling walker (2 wheeled) Transfers: Sit to/from Stand Sit to Stand: Min guard         General transfer comment: vc's required for hand and feet placement and safe technique with RW  Ambulation/Gait Ambulation/Gait assistance: Min guard Ambulation Distance (Feet): 140 Feet Assistive device: Rolling walker (2 wheeled)   Gait velocity: decreased   General Gait Details: Pt initially with difficulty advancing R LE/clearing R LE from floor but improved with distance; occasional mild R knee buckling noted during stance phase but pt able to self correct with  vc's for gait technique; vc's required for gait technique and use of RW; step to/through gait pattern   Stairs            Wheelchair Mobility    Modified Rankin (Stroke Patients Only)       Balance Overall balance assessment: Needs assistance Sitting-balance support: No upper extremity supported;Feet supported Sitting balance-Leahy Scale: Normal     Standing balance support: Bilateral upper extremity supported Standing balance-Leahy Scale: Good  Pt stood without UE support to urinate in toilet without loss of balance.                      Cognition Arousal/Alertness: Awake/alert Behavior During Therapy: WFL for tasks assessed/performed Overall Cognitive Status: Within Functional Limits for tasks assessed                      Exercises   Performed sitting exercises x 10 reps B LE's:  LAQ's (AROM R; AROM L); marching/hip flexion (AAROM R; AROM L).  Pt required vc's and tactile cues for correct technique with exercises.     General Comments General comments (skin integrity, edema, etc.): dressings intact  Nursing cleared pt for participation in physical therapy.  Pt agreeable to PT session.      Pertinent Vitals/Pain Pain Assessment: 0-10 Pain Score: 7  Pain Location: R hip Pain Descriptors / Indicators: Aching;Sore;Constant Pain Intervention(s): Limited activity within patient's tolerance;Monitored during session;Premedicated before session;Repositioned  See 1425 flowsheet for vital details.    Home Living Family/patient expects to be discharged to:: Private  residence Living Arrangements: Non-relatives/Friends Available Help at Discharge: Friend(s) Type of Home: Mobile home Home Access: Stairs to enter Entrance Stairs-Rails: Can reach both;Right;Left Home Layout: One level Home Equipment: Crutches      Prior Function Level of Independence: Independent with assistive device(s)      Comments: Using 1-2 axillary crutches depending on the  day; pt currently unemployed but actively looking for a job   PT Goals (current goals can now be found in the care plan section) Acute Rehab PT Goals Patient Stated Goal: To go home PT Goal Formulation: With patient Time For Goal Achievement: 04/18/15 Potential to Achieve Goals: Good Progress towards PT goals: Progressing toward goals    Frequency  BID    PT Plan Current plan remains appropriate    Co-evaluation             End of Session Equipment Utilized During Treatment: Gait belt Activity Tolerance: Patient tolerated treatment well Patient left: in chair;with call bell/phone within reach;with chair alarm set;with SCD's reapplied     Time: 1610-9604 PT Time Calculation (min) (ACUTE ONLY): 26 min  Charges:  $Gait Training: 8-22 mins $Therapeutic Exercise: 8-22 mins $Therapeutic Activity: 8-22 mins                    G CodesHendricks Limes 04/05/15, 2:54 PM Hendricks Limes, PT [phone]

## 2015-04-04 NOTE — Care Management Note (Addendum)
Case Management Note  Patient Details  Name: Edward Hampton MRN: [id number] Date of Birth: 05-21-1986  Subjective/Objective:                  Met with patient to discuss discharge planning. He states he plans to return home. He has one crutch that he came to the hospital with present in this room (160). He lives with a friend and his work friends will provide transportation and assistance in the home. He uses [facility] [phone] for Rx. He does not have a front-wheeled walker. PT pending.  Action/Plan: List of home health care agencies provided to this patient. Lovenox 54m #14 called in to [facility] for price. RNCM will continue to follow.   Expected Discharge Date:                  Expected Discharge Plan:     In-House Referral:     Discharge planning Services  CM Consult  Post Acute Care Choice:  Home Health, Durable Medical Equipment Choice offered to:  Patient  DME Arranged:    DME Agency:     HH Arranged:  PT HH Agency:     Status of Service:  In process, will continue to follow  Medicare Important Message Given:    Date Medicare IM Given:    Medicare IM give by:    Date Additional Medicare IM Given:    Additional Medicare Important Message give by:     [place], dates discussed:    Additional Comments: Lovenox $400- insurance information needs to be updated. Patient informed. [facility] from [facility] I have [facility]ed insurance card to [facility] [phone] Pharmacy patient's insurance expired on 03/18/15. Home health notified-- they are aware.  [facility] health cancelled. [facility]er delivered. Home health cancelled. Patient will discharge on Aspirin. [facility]   AM[facility]hell Garfinkel RN 04/04/2015, 9:37 AM

## 2015-04-04 NOTE — Evaluation (Signed)
Occupational Therapy Evaluation Patient Details Name: Edward Hampton MRN: [id number] DOB: February 07, 1986 Today's Date: 04/04/2015    History of Present Illness Pt is a 29 y.o. 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  PMH:  htn.   Clinical Impression   Pt lives in a mobile home with a male roommate who works at [employer] he was recently let go from.  He was independent in all ADLs prior to surgery and is eager to return to PLOF.  Pt is currently limited in functional ADLs due to pain, and decreased ROM.  Pt requires minimal assist for LB dressing and bathing skills due to pain and decreased AROM of R LE when using reacher and sock aid.  He would benefit from continued skilled OT services for education in assistive devices, functional mobility, and education in recommendations for home modifications to increase safety and prevent falls.  Pt to be monitored to see if he needs [facility] OT after DC home.       Follow Up Recommendations  Home health OT (depending on progress in hospital)    Equipment Recommendations  Tub/shower seat    Recommendations for Other Services       Precautions / Restrictions Precautions Precautions: Anterior Hip Precaution Booklet Issued: Yes (comment) Restrictions Weight Bearing Restrictions: Yes RLE Weight Bearing: Weight bearing as tolerated      Mobility Bed Mobility Overal bed mobility: Needs Assistance Bed Mobility: Supine to Sit     Supine to sit: Min assist;HOB elevated     General bed mobility comments: assist for R LE  Transfers Overall transfer level: Needs assistance Equipment used: Rolling walker (2 wheeled) Transfers: Sit to/from Stand Sit to Stand: Min guard         General transfer comment: vc's required for hand and feet placement and safe technique with RW    Balance Overall balance assessment: Needs assistance Sitting-balance support: No upper extremity supported;Feet supported Sitting  balance-Leahy Scale: Normal     Standing balance support: Bilateral upper extremity supported Standing balance-Leahy Scale: Good                              ADL                                         General ADL Comments: Pt is Independent in grooming, hygiene and UB dressing skills and is Dependent for LB dressing without AD and min assist with AD due to pain.  It is anticipated that pt will progress well and not need AD but will work on during therapy while in the hospital.  He is currently unemployed and does not have any health insurance and his roomate in his 72s and also works at [employer] he was working at and can help with [employer], meals, Academic librarian.     Advice worker      Pertinent Vitals/Pain Pain Assessment: 0-10 Pain Score: 6  Pain Location: R hip Pain Descriptors / Indicators: Aching;Sore;Constant Pain Intervention(s): Limited activity within patient's tolerance;Monitored during session;Premedicated before session     Hand Dominance Right   Extremity/Trunk Assessment Upper Extremity Assessment Upper Extremity Assessment: Overall WFL for tasks assessed   Lower Extremity Assessment Lower Extremity Assessment: RLE deficits/detail;LLE deficits/detail RLE Deficits / Details: R  hip flexion 2+/5; R knee extension at least 3+/5; R DF at least 4/5 RLE: Unable to fully assess due to pain LLE Deficits / Details: [facility]       Communication Communication Communication: No difficulties   Cognition Arousal/Alertness: Awake/alert Behavior During Therapy: WFL for tasks assessed/performed Overall Cognitive Status: Within Functional Limits for tasks assessed                     General Comments       Exercises       Shoulder Instructions      Home Living Family/patient expects to be discharged to:: Private residence Living Arrangements: Non-relatives/Friends Available Help at Discharge:  Friend(s) Type of Home: Mobile home Home Access: Stairs to enter Secretary/administrator of Steps: 5 Entrance Stairs-Rails: Can reach both;Right;Left Home Layout: One level     Bathroom Shower/Tub: Tub/shower unit Shower/tub characteristics: Engineer, building services: Pharmacist, community: No   Home Equipment: Crutches          Prior Functioning/Environment Level of Independence: Independent with assistive device(s)        Comments: Using 1-2 axillary crutches depending on the day; pt currently unemployed but actively looking for a job    OT Diagnosis: Generalized weakness   OT Problem List: Decreased strength;Decreased activity tolerance;Pain   OT Treatment/Interventions: Self-care/ADL training;DME and/or AE instruction    OT Goals(Current goals can be found in the care plan section) Acute Rehab OT Goals Patient Stated Goal: To go home OT Goal Formulation: With patient Time For Goal Achievement: 04/18/15 Potential to Achieve Goals: Good  OT Frequency: Min 1X/week   Barriers to D/C:            Co-evaluation              End of Session Equipment Utilized During Treatment:  (reacher and sock aid)  Activity Tolerance: Patient tolerated treatment well Patient left: in chair;with call bell/phone within reach;with chair alarm set   Time: 1610-9604 OT Time Calculation (min): 30 min Charges:  OT General Charges $OT Visit: 1 Procedure OT Evaluation $Initial OT Evaluation Tier I: 1 Procedure OT Treatments $Self Care/Home Management : 8-22 mins G-Codes:    Vasilisa Vore 04-05-2015, 11:23 AM    Susanne Borders, OTR/L ascom [phone]

## 2015-04-04 NOTE — Progress Notes (Signed)
   Subjective: 1 Day Post-Op Procedure(s) (LRB): TOTAL HIP ARTHROPLASTY ANTERIOR APPROACH (Right) Patient reports pain as 4 on 0-10 scale.   Patient is well, and has had no acute complaints or problems We will start therapy today.  Plan is to go Home after hospital stay.  Objective: Vital signs in last 24 hours: Temp:  [97.3 F (36.3 C)-98.3 F (36.8 C)] 97.5 F (36.4 C) (08/17 0742) Pulse Rate:  [77-108] 84 (08/17 0742) Resp:  [10-27] 16 (08/17 0742) BP: (121-167)/(72-100) 149/95 mmHg (08/17 0742) SpO2:  [93 %-100 %] 100 % (08/17 0742) FiO2 (%):  [21 %] 21 % (08/16 1155)  Intake/Output from previous day: 08/16 0701 - 08/17 0700 In: 2443.8 [I.V.:2193.8; IV Piggyback:100] Out: 6950 [Urine:6800; Blood:150] Intake/Output this shift: Total I/O In: -  Out: 800 [Urine:800]   Recent Labs  04/03/15 1901 04/04/15 0631  HGB 15.4 14.5    Recent Labs  04/03/15 1901 04/04/15 0631  WBC 13.9* 11.6*  RBC 4.83 4.52  HCT 47.1 44.0  PLT 238 218    Recent Labs  04/03/15 1901 04/04/15 0631  NA  --  140  K  --  4.4  CL  --  102  CO2  --  31  BUN  --  11  CREATININE 0.83 0.82  GLUCOSE  --  113*  CALCIUM  --  9.1   No results for input(s): LABPT, INR in the last 72 hours.  EXAM General - Patient is Alert, Appropriate and Oriented Extremity - Neurovascular intact Sensation intact distally Intact pulses distally Dorsiflexion/Plantar flexion intact Dressing - dressing C/D/I and no drainage Motor Function - intact, moving foot and toes well on exam.   Past Medical History  Diagnosis Date  . Hip pain     avascular necrosis    Assessment/Plan:   1 Day Post-Op Procedure(s) (LRB): TOTAL HIP ARTHROPLASTY ANTERIOR APPROACH (Right) Active Problems:   Osteonecrosis  Estimated body mass index is 25.73 kg/(m^2) as calculated from the following:   Height as of this encounter:  (1.854 m).   Weight as of this encounter: 88.451 kg (195 lb). Advance diet Up with  therapy  Needs BM before discharge Recheck labs in the am  DVT Prophylaxis - Lovenox, Foot Pumps and TED hose Weight-Bearing as tolerated to right leg D/C O2 and Pulse OX and try on Room Air  T. Cranston Neighbor, PA-C [facility] 04/04/2015, 8:02 AM

## 2015-04-05 LAB — CBC
HCT: 44.9 % (ref 40.0–52.0)
Hemoglobin: 14.9 g/dL (ref 13.0–18.0)
MCH: 32.1 pg (ref 26.0–34.0)
MCHC: 33.1 g/dL (ref 32.0–36.0)
MCV: 97.1 fL (ref 80.0–100.0)
PLATELETS: 207 10*3/uL (ref 150–440)
RBC: 4.63 MIL/uL (ref 4.40–5.90)
RDW: 13.8 % (ref 11.5–14.5)
WBC: 11.5 10*3/uL — ABNORMAL HIGH (ref 3.8–10.6)

## 2015-04-05 LAB — HIV ANTIBODY (ROUTINE TESTING W REFLEX): HIV Screen 4th Generation wRfx: NONREACTIVE

## 2015-04-05 LAB — SURGICAL PATHOLOGY

## 2015-04-05 NOTE — Progress Notes (Signed)
Physical Therapy Treatment Patient Details Name: Edward Hampton MRN: [id number] DOB: 1986-06-18 Today's Date: 04/05/2015    History of Present Illness Pt is a 29 y.o. 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  PMH:  htn.    PT Comments    Pt able to navigate 4 stairs with B railings CGA this afternoon.  Pt still has some difficulty advancing R LE with gait but improves with repetition.  Overall pt is progressing well with functional mobility using RW.  Recommend pt discharge to home with RW, HHPT, and support of roommates as needed when medically appropriate.  Follow Up Recommendations  Home health PT     Equipment Recommendations  Rolling walker with 5" wheels    Recommendations for Other Services       Precautions / Restrictions Precautions Precautions: Anterior Hip Precaution Booklet Issued: Yes (comment) Restrictions Weight Bearing Restrictions: Yes RLE Weight Bearing: Weight bearing as tolerated    Mobility  Bed Mobility               General bed mobility comments: Not assessed d/t pt in chair beginning of session and wanting to be in chair end of session  Transfers Overall transfer level: Needs assistance Equipment used: Rolling walker (2 wheeled) Transfers: Sit to/from Stand Sit to Stand: Supervision         General transfer comment: no vc's required for hand and feet placement and safe technique with RW  Ambulation/Gait Ambulation/Gait assistance: Supervision Ambulation Distance (Feet):  (90 feet; 170 feet) Assistive device: Rolling walker (2 wheeled)   Gait velocity: decreased   General Gait Details: Pt initially with difficulty advancing R LE/clearing R LE from floor first 4-5 steps but then improved;  occasional vc's required for gait technique and use of RW; step to/through gait pattern   Stairs Stairs: Yes Stairs assistance: Min guard Stair Management: Two rails;Step to pattern Number of Stairs: 4 General  stair comments: vc's and demo required for stepping technique  Wheelchair Mobility    Modified Rankin (Stroke Patients Only)       Balance                                    Cognition Arousal/Alertness: Awake/alert Behavior During Therapy: WFL for tasks assessed/performed Overall Cognitive Status: Within Functional Limits for tasks assessed                      Exercises      General Comments  Pt agreeable to PT session.      Pertinent Vitals/Pain Pain Assessment: 0-10 Pain Score: 4  Pain Location: R hip Pain Descriptors / Indicators: Aching;Constant;Sore Pain Intervention(s): Limited activity within patient's tolerance;Monitored during session;Repositioned  See flow sheet for details.    Home Living                      Prior Function            PT Goals (current goals can now be found in the care plan section) Acute Rehab PT Goals Patient Stated Goal: To go home PT Goal Formulation: With patient Time For Goal Achievement: 04/18/15 Potential to Achieve Goals: Good Progress towards PT goals: Progressing toward goals    Frequency  BID    PT Plan Current plan remains appropriate    Co-evaluation  End of Session Equipment Utilized During Treatment: Gait belt Activity Tolerance: Patient tolerated treatment well Patient left: in chair;with call bell/phone within reach;with chair alarm set     Time: 1405-1430 PT Time Calculation (min) (ACUTE ONLY): 25 min  Charges:  $Gait Training: 23-37 mins                    G CodesHendricks Limes 04-14-2015, 3:47 PM Hendricks Limes, PT [phone]

## 2015-04-05 NOTE — Progress Notes (Signed)
Physical Therapy Treatment Patient Details Name: Edward Hampton MRN: [id number] DOB: August 12, 1986 Today's Date: 04/05/2015    History of Present Illness Pt is a 29 y.o. 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  PMH:  htn.    PT Comments    Pt progressing well with functional mobility and able to ambulate nursing loop with CGA and RW.  Plan to attempt stairs this afternoon.  Pt continues to demonstrate R hip weakness.  Recommend HHPT (pt reports he does not have car and does not have transportation to go to OP PT).  Follow Up Recommendations  Home health PT     Equipment Recommendations  Rolling walker with 5" wheels    Recommendations for Other Services       Precautions / Restrictions Precautions Precautions: Anterior Hip Precaution Booklet Issued: Yes (comment) Restrictions Weight Bearing Restrictions: Yes RLE Weight Bearing: Weight bearing as tolerated    Mobility  Bed Mobility               General bed mobility comments: Not assessed d/t pt in chair beginning of session and wanting to be in chair end of session  Transfers Overall transfer level: Needs assistance Equipment used: Rolling walker (2 wheeled) Transfers: Sit to/from Stand Sit to Stand: Supervision         General transfer comment: no vc's required for hand and feet placement and safe technique with RW  Ambulation/Gait Ambulation/Gait assistance: Min guard Ambulation Distance (Feet): 200 Feet Assistive device: Rolling walker (2 wheeled)   Gait velocity: decreased   General Gait Details: Pt initially with difficulty advancing R LE/clearing R LE from floor first 2 steps but then improved;  occasional vc's required for gait technique and use of RW; step to/through gait pattern   Stairs            Wheelchair Mobility    Modified Rankin (Stroke Patients Only)       Balance   Sitting-balance support: No upper extremity supported;Feet supported Sitting  balance-Leahy Scale: Normal     Standing balance support: Bilateral upper extremity supported Standing balance-Leahy Scale: Good                      Cognition Arousal/Alertness: Awake/alert Behavior During Therapy: WFL for tasks assessed/performed Overall Cognitive Status: Within Functional Limits for tasks assessed                      Exercises   Performed sitting exercises x 15 reps B LE's:  LAQ's (AROM R; AROM L); marching/hip flexion (AAROM R; AROM L).  Pt required vc's and tactile cues for correct technique with exercises.  Pt able to verbalize R anterior hip precautions.     General Comments General comments (skin integrity, edema, etc.): dressing intact; minimal drainage noted      Pertinent Vitals/Pain Pain Assessment: 0-10 Pain Score: 4  Pain Location: R hip Pain Descriptors / Indicators: Aching;Sore;Constant Pain Intervention(s): Limited activity within patient's tolerance;Monitored during session;Premedicated before session;Repositioned  Vitals WNL for pt during hospital stay.    Home Living                      Prior Function            PT Goals (current goals can now be found in the care plan section) Acute Rehab PT Goals Patient Stated Goal: To go home PT Goal Formulation: With patient Time For Goal Achievement:  04/18/15 Potential to Achieve Goals: Good Progress towards PT goals: Progressing toward goals    Frequency  BID    PT Plan Current plan remains appropriate    Co-evaluation             End of Session Equipment Utilized During Treatment: Gait belt Activity Tolerance: Patient tolerated treatment well Patient left: in chair;with call bell/phone within reach;with chair alarm set;with SCD's reapplied     Time: 0935-1005 PT Time Calculation (min) (ACUTE ONLY): 30 min  Charges:  $Gait Training: 8-22 mins $Therapeutic Exercise: 8-22 mins                    G CodesHendricks Hampton 2015-04-07, 10:16  AM Edward Hampton, PT [phone]

## 2015-04-05 NOTE — Progress Notes (Signed)
   Subjective: 2 Days Post-Op Procedure(s) (LRB): TOTAL HIP ARTHROPLASTY ANTERIOR APPROACH (Right) Patient reports pain as mild. Patient is well, and has had no acute complaints or problems We will continue physical therapy today.  Plan is to go Home after hospital stay.  Objective: Vital signs in last 24 hours: Temp:  [97.5 F (36.4 C)-98.2 F (36.8 C)] 97.5 F (36.4 C) (08/18 0446) Pulse Rate:  [81-134] 81 (08/18 0446) Resp:  [16-21] 18 (08/18 0446) BP: (128-149)/(82-94) 149/94 mmHg (08/18 0446) SpO2:  [94 %-100 %] 100 % (08/18 0446)  Intake/Output from previous day: 08/17 0701 - 08/18 0700 In: 3533.3 [P.O.:1550; I.V.:1983.3] Out: 5150 [Urine:5150] Intake/Output this shift:     Recent Labs  04/03/15 1901 04/04/15 0631 04/05/15 0629  HGB 15.4 14.5 14.9    Recent Labs  04/04/15 0631 04/05/15 0629  WBC 11.6* 11.5*  RBC 4.52 4.63  HCT 44.0 44.9  PLT 218 207    Recent Labs  04/03/15 1901 04/04/15 0631  NA  --  140  K  --  4.4  CL  --  102  CO2  --  31  BUN  --  11  CREATININE 0.83 0.82  GLUCOSE  --  113*  CALCIUM  --  9.1   No results for input(s): LABPT, INR in the last 72 hours.  EXAM General - Patient is Alert, Appropriate and Oriented Extremity - Neurovascular intact Sensation intact distally Intact pulses distally Dorsiflexion/Plantar flexion intact Dressing - dressing C/D/I and no drainage. Dressing changed Motor Function - intact, moving foot and toes well on exam.   Past Medical History  Diagnosis Date  . Hip pain     avascular necrosis    Assessment/Plan:   2 Days Post-Op Procedure(s) (LRB): TOTAL HIP ARTHROPLASTY ANTERIOR APPROACH (Right) Active Problems:   Osteonecrosis  Estimated body mass index is 25.73 kg/(m^2) as calculated from the following:   Height as of this encounter:  (1.854 m).   Weight as of this encounter: 88.451 kg (195 lb). Advance diet Up with therapy  Plan on discharge to home with home home health  Friday. Patient could discharge home today but he does not have help at home.  DVT Prophylaxis - Lovenox, Foot Pumps and TED hose Weight-Bearing as tolerated to right leg D/C O2 and Pulse OX and try on Room Air  T. Cranston Neighbor, PA-C [facility] 04/05/2015, 7:56 AM

## 2015-04-06 MED ORDER — OXYCODONE HCL 5 MG PO TABS: 5.0000 mg | ORAL_TABLET | ORAL | Status: AC | PRN

## 2015-04-06 MED ORDER — ASPIRIN EC 325 MG PO TBEC: 325.0000 mg | DELAYED_RELEASE_TABLET | Freq: Every day | ORAL | Status: AC

## 2015-04-06 NOTE — Progress Notes (Signed)
Occupational Therapy Treatment Patient Details Name: Jaidyn Kuhl MRN: [id number] DOB: 12-Jul-1986 Today's Date: 04/06/2015    History of present illness Pt is a 29 y.o. 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  PMH:  htn.   OT comments  Pt seen for ADL training and is able to reach feet and no longer in need of any AD.  Discussed use of a shower chair to prevent falls but he declined indicating that he was able to manage before surgery.  No further OT needs due to being independent in ADLs without assistive devices.  Follow Up Recommendations  No OT follow up    Equipment Recommendations  Tub/shower seat (pt declined use of shower chair)    Recommendations for Other Services      Precautions / Restrictions Precautions Precautions: Anterior Hip Precaution Booklet Issued: Yes (comment) Restrictions Weight Bearing Restrictions: Yes RLE Weight Bearing: Weight bearing as tolerated       Mobility Bed Mobility Overal bed mobility: Modified Independent Bed Mobility: Supine to Sit;Sit to Supine     Supine to sit: Modified independent (Device/Increase time) Sit to supine: Modified independent (Device/Increase time)   General bed mobility comments: pt uses L LE to assist with R LE  Transfers Overall transfer level: Modified independent Equipment used: Rolling walker (2 wheeled) Transfers: Sit to/from [employer] Sit to Stand: Modified independent (Device/Increase time) Stand pivot transfers: Modified independent (Device/Increase time)       General transfer comment: no vc's required for hand and feet placement and safe technique with RW; steady without loss of balance    Balance Overall balance assessment: No apparent balance deficits (not formally assessed) Sitting-balance support: No upper extremity supported;Feet supported Sitting balance-Leahy Scale: Normal     Standing balance support: During functional  activity Standing balance-Leahy Scale: Good (uses RW to steady as needed without loss of balance)                     ADL                                         General ADL Comments: Pt has progressed with trunk flexion and able to reach feet to complete donning/doffing socks and simulate pants over feet.  Discussed set up of shower and home to prevent falls.  Pt not need of any AD for ADLs.  Rec shower seat with back but pt stated  he was standing in shower prior  to surgery and felt safe.      Vision                     Perception     Praxis      Cognition   Behavior During Therapy: WFL for tasks assessed/performed Overall Cognitive Status: Within Functional Limits for tasks assessed                       Extremity/Trunk Assessment               Exercises     Shoulder Instructions       General Comments      Pertinent Vitals/ Pain       Pain Assessment: 0-10 Pain Score: 4  Pain Location: R hip Pain Descriptors / Indicators: Aching Pain Intervention(s): Monitored during session;Premedicated before session  Home Living  Prior Functioning/Environment              Frequency Min 1X/week     Progress Toward Goals  OT Goals(current goals can now be found in the care plan section)  Progress towards OT goals: Progressing toward goals  Acute Rehab OT Goals Patient Stated Goal: To go home OT Goal Formulation: With patient Time For Goal Achievement: 04/20/15 Potential to Achieve Goals: Good  Plan      Co-evaluation                 End of Session     Activity Tolerance Patient tolerated treatment well   Patient Left in chair;with call bell/phone within reach;with chair alarm set   Nurse Communication          Time: [id number] OT Time Calculation (min): 20 min  Charges: OT General Charges $OT Visit: 1 Procedure OT Treatments $Self  Care/Home Management : 8-22 mins  Wofford,Susan 04/06/2015, 9:54 AM    Susanne Borders, OTR/L ascom [phone]

## 2015-04-06 NOTE — Progress Notes (Signed)
PT READY FOR DISCHARGE  BUT HAS NO WAY TO PAY FOR PAIN MEDS. BAILEY -SOCIAL WORKER INFORMED.

## 2015-04-06 NOTE — Care Management (Signed)
Patient given coupon for medications at a cost of $11.04  Patient stated he could afford this. Expressed appreciation.

## 2015-04-06 NOTE — Discharge Instructions (Signed)

## 2015-04-06 NOTE — Progress Notes (Signed)
Physical Therapy Treatment Patient Details Name: Edward Hampton MRN: [id number] DOB: November 05, 1985 Today's Date: 04/06/2015    History of Present Illness Pt is a 29 y.o. 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  PMH:  htn.    PT Comments    Pt steady with functional mobility using RW (pt able to advance R LE without any significant difficulties today with functional mobility but still demonstrates R hip weakness) and able to navigate 4 stairs with B railings supervision safely.  Pt able to perform supine and sitting LE HEP (anterior total hip) handout appropriately.  Pt appears safe to discharge home with RW, continued PT, and support from roommates as needed.  Plan to discharge home today.  Follow Up Recommendations  Home health PT (Pt reports that he may not be able to get HHPT d/t insurance issues and CM is working on [facility] for OP PT for pt)     Engineer, agricultural with 5" wheels    Recommendations for Other Services       Precautions / Restrictions Precautions Precautions: Anterior Hip Precaution Booklet Issued: Yes (comment) Restrictions Weight Bearing Restrictions: Yes RLE Weight Bearing: Weight bearing as tolerated    Mobility  Bed Mobility Overal bed mobility: Modified Independent Bed Mobility: Supine to Sit;Sit to Supine     Supine to sit: Modified independent (Device/Increase time) Sit to supine: Modified independent (Device/Increase time)   General bed mobility comments: pt uses L LE to assist with R LE  Transfers Overall transfer level: Modified independent Equipment used: Rolling walker (2 wheeled) Transfers: Sit to/from [employer] Sit to Stand: Modified independent (Device/Increase time) Stand pivot transfers: Modified independent (Device/Increase time)       General transfer comment: no vc's required for hand and feet placement and safe technique with RW; steady without loss of  balance  Ambulation/Gait Ambulation/Gait assistance: Modified independent (Device/Increase time) Ambulation Distance (Feet): 240 Feet Assistive device: Rolling walker (2 wheeled) Gait Pattern/deviations: Step-through pattern;Decreased stance time - right;Decreased step length - left;Antalgic   Gait velocity interpretation: >2.62 ft/sec, indicative of independent community ambulator General Gait Details: pt able to advance R LE with all activites today without significant difficulty   Stairs Stairs: Yes Stairs assistance: Supervision Stair Management: Two rails;Step to pattern Number of Stairs: 4 General stair comments: no vc's or demo required for stepping technique or safe technique  Wheelchair Mobility    Modified Rankin (Stroke Patients Only)       Balance Overall balance assessment: No apparent balance deficits (not formally assessed) Sitting-balance support: No upper extremity supported;Feet supported Sitting balance-Leahy Scale: Normal     Standing balance support: During functional activity Standing balance-Leahy Scale: Good (uses RW to steady as needed without loss of balance)                      Cognition Arousal/Alertness: Awake/alert Behavior During Therapy: WFL for tasks assessed/performed Overall Cognitive Status: Within Functional Limits for tasks assessed                      Exercises   Performed x5 B LE semi-supine therapeutic exercise:  Quad sets with 3 second holds (AROM R; AROM L); hip Adduction isometrics x3 seconds (pillow between thighs) (AROM B LE's); ankle pumps (AROM B LE's); gluteal sets x3 second holds (AROM B); heelslides (AROM R; AROM L); SAQ's (AROM R; AROM L); hip abduction/adduction (AAROM R; AROM L); and SLR (AAROM  R; AROM L).  Pt used bed sheet for assisting R LE with hip abd/adduction and SLR safely and good pain control.     General Comments   Nursing cleared pt for participation in physical therapy.  Pt agreeable to  PT session.       Pertinent Vitals/Pain Pain Assessment: 0-10 Pain Score: 4  Pain Location: R hip Pain Descriptors / Indicators: Constant;Aching;Sore Pain Intervention(s): Limited activity within patient's tolerance;Monitored during session;Premedicated before session;Repositioned  Vitals stable and WNL for pt throughout treatment session.    Home Living                      Prior Function            PT Goals (current goals can now be found in the care plan section) Acute Rehab PT Goals Patient Stated Goal: To go home PT Goal Formulation: With patient Time For Goal Achievement: 04/18/15 Potential to Achieve Goals: Good Progress towards PT goals: Progressing toward goals    Frequency  BID    PT Plan Current plan remains appropriate    Co-evaluation             End of Session Equipment Utilized During Treatment: Gait belt Activity Tolerance: Patient tolerated treatment well Patient left: in chair;with call bell/phone within reach;with chair alarm set     Time: 6962-9528 PT Time Calculation (min) (ACUTE ONLY): 30 min  Charges:  $Gait Training: 8-22 mins $Therapeutic Exercise: 8-22 mins                    G CodesHendricks Limes April 16, 2015, 9:39 AM Hendricks Limes, PT [phone]

## 2015-04-06 NOTE — Discharge Summary (Signed)
Physician Discharge Summary  Patient ID: Edward Hampton MRN: [id number] DOB/AGE: 1985/10/11 29 y.o.  Admit date: 04/03/2015 Discharge date: 04/06/2015  Admission Diagnoses:  RIGHT AVASCULAR NECNOSIS   Discharge Diagnoses: Patient Active Problem List   Diagnosis Date Noted  . Osteonecrosis 04/03/2015    Past Medical History  Diagnosis Date  . Hip pain     avascular necrosis      Discharged Condition: Improved  Hospital Course: Thurlow Gallaga is an 29 y.o. male who was admitted 04/03/2015 with a diagnosis of right hip AVN and went to the operating room on 04/03/2015 and underwent the above named procedures.    Surgeries: Procedure(s): TOTAL HIP ARTHROPLASTY ANTERIOR APPROACH on 04/03/2015 Patient tolerated the surgery well. Taken to PACU where she was stabilized and then transferred to the orthopedic floor.  Started on Lovenox 30 q 12 hrs. Foot pumps applied bilaterally at 80 mm. Heels elevated on bed with rolled towels. No evidence of DVT. Negative Homan. Physical therapy started on day #1 for gait training and transfer. OT started day #1 for ADL and assisted devices.  Patient's foley was d/c on day #1. Patient's IV and hemovac was d/c on day #2.  On post op day #3 patient was stable and ready for discharge to home.  Implants: Medacta AMIS 3 standard stem, 58 mm Mpact DM cup and liner, M 28 mm head  He was given perioperative antibiotics:  Anti-infectives    Start     Dose/Rate Route Frequency Ordered Stop   04/03/15 1200  clindamycin (CLEOCIN) IVPB 600 mg     600 mg 100 mL/hr over 30 Minutes Intravenous Every 6 hours 04/03/15 1154 04/03/15 1756   04/03/15 0553  clindamycin (CLEOCIN) 900 MG/50ML IVPB    Comments:  Nicholes Rough: cabinet override      04/03/15 0553 04/03/15 0724   04/03/15 0204  clindamycin (CLEOCIN) IVPB 900 mg  Status:  Discontinued     900 mg 100 mL/hr over 30 Minutes Intravenous On call to O.R. 04/03/15 3474 04/03/15 1147    .  He was given sequential  compression devices, early ambulation, and aspirin 325 mg for DVT prophylaxis.  He benefited maximally from the hospital stay and there were no complications.    Recent vital signs:  Filed Vitals:   04/06/15 0354  BP: 145/94  Pulse: 102  Temp: 97.9 F (36.6 C)  Resp: 20    Recent laboratory studies:  Lab Results  Component Value Date   HGB 14.9 04/05/2015   HGB 14.5 04/04/2015   HGB 15.4 04/03/2015   Lab Results  Component Value Date   WBC 11.5* 04/05/2015   PLT 207 04/05/2015   Lab Results  Component Value Date   INR 0.89 03/22/2015   Lab Results  Component Value Date   NA 140 04/04/2015   K 4.4 04/04/2015   CL 102 04/04/2015   CO2 31 04/04/2015   BUN 11 04/04/2015   CREATININE 0.82 04/04/2015   GLUCOSE 113* 04/04/2015    Discharge Medications:     Medication List    TAKE these medications        aspirin EC 325 MG tablet  Take 1 tablet (325 mg total) by mouth daily.     lisinopril-hydrochlorothiazide 10-12.5 MG per tablet  Commonly known as:  PRINZIDE,ZESTORETIC  Take 1 tablet by mouth daily.     oxyCODONE 5 MG immediate release tablet  Commonly known as:  Oxy IR/ROXICODONE  Take 1-2 tablets (5-10 mg total) by mouth every  4 (four) hours as needed (moderate pain).     traMADol 50 MG tablet  Commonly known as:  ULTRAM  Take 50 mg by mouth every 4 (four) hours. As needed        Diagnostic Studies: Dg Hip Operative Unilat With Pelvis Right  04/03/2015   CLINICAL DATA:  Right hip replacement  EXAM: OPERATIVE RIGHT HIP (WITH PELVIS IF PERFORMED) 1 VIEWS  TECHNIQUE: Fluoroscopic spot image(s) were submitted for interpretation post-operatively.  COMPARISON:  None.  FINDINGS: Single intraoperative spot image demonstrates changes of right hip replacement. Normal AP alignment on this single spot image. No visible complicating feature.  IMPRESSION: Right hip replacement without visible complicating feature.   Electronically Signed   By: Charlett Nose M.D.   On:  04/03/2015 09:13   Dg Hip Unilat W Or W/o Pelvis 2-3 Views Right  04/03/2015   CLINICAL DATA:  Post right hip arthroplasty  EXAM: DG HIP (WITH OR WITHOUT PELVIS) 2-3V RIGHT  COMPARISON:  None.  FINDINGS: Two views of the right hip submitted. There is right hip prosthesis with anatomic alignment. Postsurgical changes are noted with lateral skin staples. Periarticular soft tissue air.  IMPRESSION: Right hip prosthesis with anatomic alignment. Postsurgical changes are noted.   Electronically Signed   By: Natasha Mead M.D.   On: 04/03/2015 10:01    Disposition: Discharge home, stable condition      Signed: Amador Cunas CHRISTOPHER 04/06/2015, 8:09 AM

## 2015-04-06 NOTE — Care Management (Signed)
Contacted Dr. Rosita Kea for outpatient PT order.  Per Dr. Rosita Kea patient does not need out patient PT set up.  Notifed Case manager [employer].

## 2015-04-06 NOTE — Progress Notes (Signed)
   Subjective: 3 Days Post-Op Procedure(s) (LRB): TOTAL HIP ARTHROPLASTY ANTERIOR APPROACH (Right) Patient reports pain as mild. Patient is well, and has had no acute complaints or problems  No CP/SOB/Cough/fevers/calf pain We will continue physical therapy today.  Plan is to go Home after hospital stay.  Objective: Vital signs in last 24 hours: Temp:  [97.6 F (36.4 C)-98.3 F (36.8 C)] 97.9 F (36.6 C) (08/19 0354) Pulse Rate:  [99-131] 102 (08/19 0354) Resp:  [16-20] 20 (08/19 0354) BP: (114-164)/(72-98) 145/94 mmHg (08/19 0354) SpO2:  [96 %-100 %] 99 % (08/19 0354)  Intake/Output from previous day: 08/18 0701 - 08/19 0700 In: 150 [P.O.:150] Out: 4350 [Urine:4350] Intake/Output this shift: Total I/O In: -  Out: 700 [Urine:700]   Recent Labs  04/03/15 1901 04/04/15 0631 04/05/15 0629  HGB 15.4 14.5 14.9    Recent Labs  04/04/15 0631 04/05/15 0629  WBC 11.6* 11.5*  RBC 4.52 4.63  HCT 44.0 44.9  PLT 218 207    Recent Labs  04/03/15 1901 04/04/15 0631  NA  --  140  K  --  4.4  CL  --  102  CO2  --  31  BUN  --  11  CREATININE 0.83 0.82  GLUCOSE  --  113*  CALCIUM  --  9.1   No results for input(s): LABPT, INR in the last 72 hours.  EXAM General - Patient is Alert, Appropriate and Oriented Extremity - Neurovascular intact Sensation intact distally Intact pulses distally Dorsiflexion/Plantar flexion intact Dressing - dressing C/D/I and no drainage. Dressing changed Motor Function - intact, moving foot and toes well on exam.   Past Medical History  Diagnosis Date  . Hip pain     avascular necrosis    Assessment/Plan:   3 Days Post-Op Procedure(s) (LRB): TOTAL HIP ARTHROPLASTY ANTERIOR APPROACH (Right) Active Problems:   Osteonecrosis  Estimated body mass index is 25.73 kg/(m^2) as calculated from the following:   Height as of this encounter:  (1.854 m).   Weight as of this encounter: 88.451 kg (195 lb). Advance diet Up with  therapy  Discharge home today Follow up with [facility] in 2 weeks  DVT Prophylaxis - Lovenox, Foot Pumps and TED hose Weight-Bearing as tolerated to right leg D/C O2 and Pulse OX and try on Room Air  T. Cranston Neighbor, PA-C [facility] 04/06/2015, 8:01 AM

## 2015-04-06 NOTE — Care Management (Signed)
Patient given contact information for [facility] and expressed understanding of appointment procedure. Face sheet, H&P, Physical Therapy notes, and Operative notes faxed to [facility] by Bevelyn Ngo RN. Patient will discharge with outpatient therapy. Placed Call to [facility] for appointment. [facility] is staffed by volunteers and does not have office staff available for call back. Left voicemail for with my contact information.  I then received a call from Bevelyn Ngo RN who stated that she had spoke with Dr Rosita Kea concerning [facility]. Dr Rosita Kea stated th her that the patient would be OK with PT and that he did not want the patient to got  [facility]. Patient informed of this by the Clinical research associate. Patient has walker and will be discharge to home with self care, patient stated that he has safe environment and support of friends. See note from Bevelyn Ngo RN.

## 2017-04-14 IMAGING — CR DG HIP (WITH OR WITHOUT PELVIS) 2-3V*R*
1 series · 2 of 2 positions shown · non-contrast
Comparison: None.

CLINICAL DATA: Post right hip arthroplasty

EXAM:
DG HIP (WITH OR WITHOUT PELVIS) 2-3V RIGHT

[Series 1: lat · 0.17mm/px · 2 of 2 slices shown]
[im 1/2]
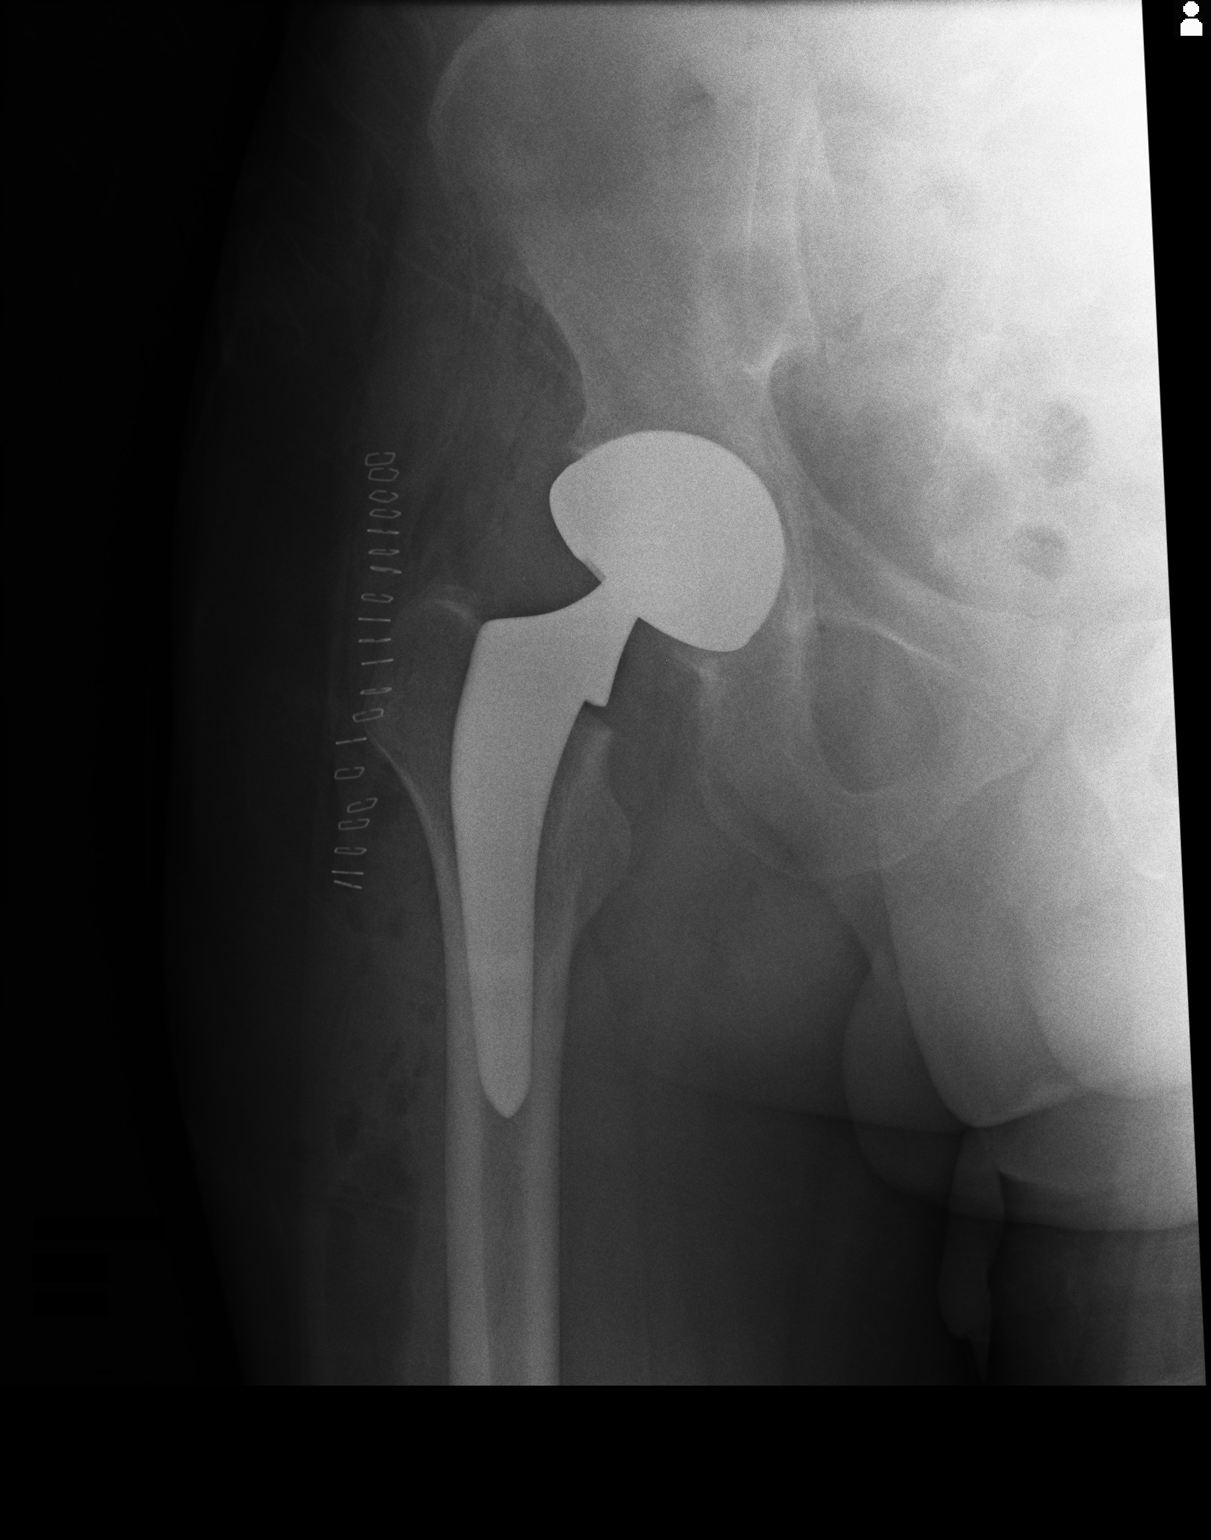
[im 2/2]
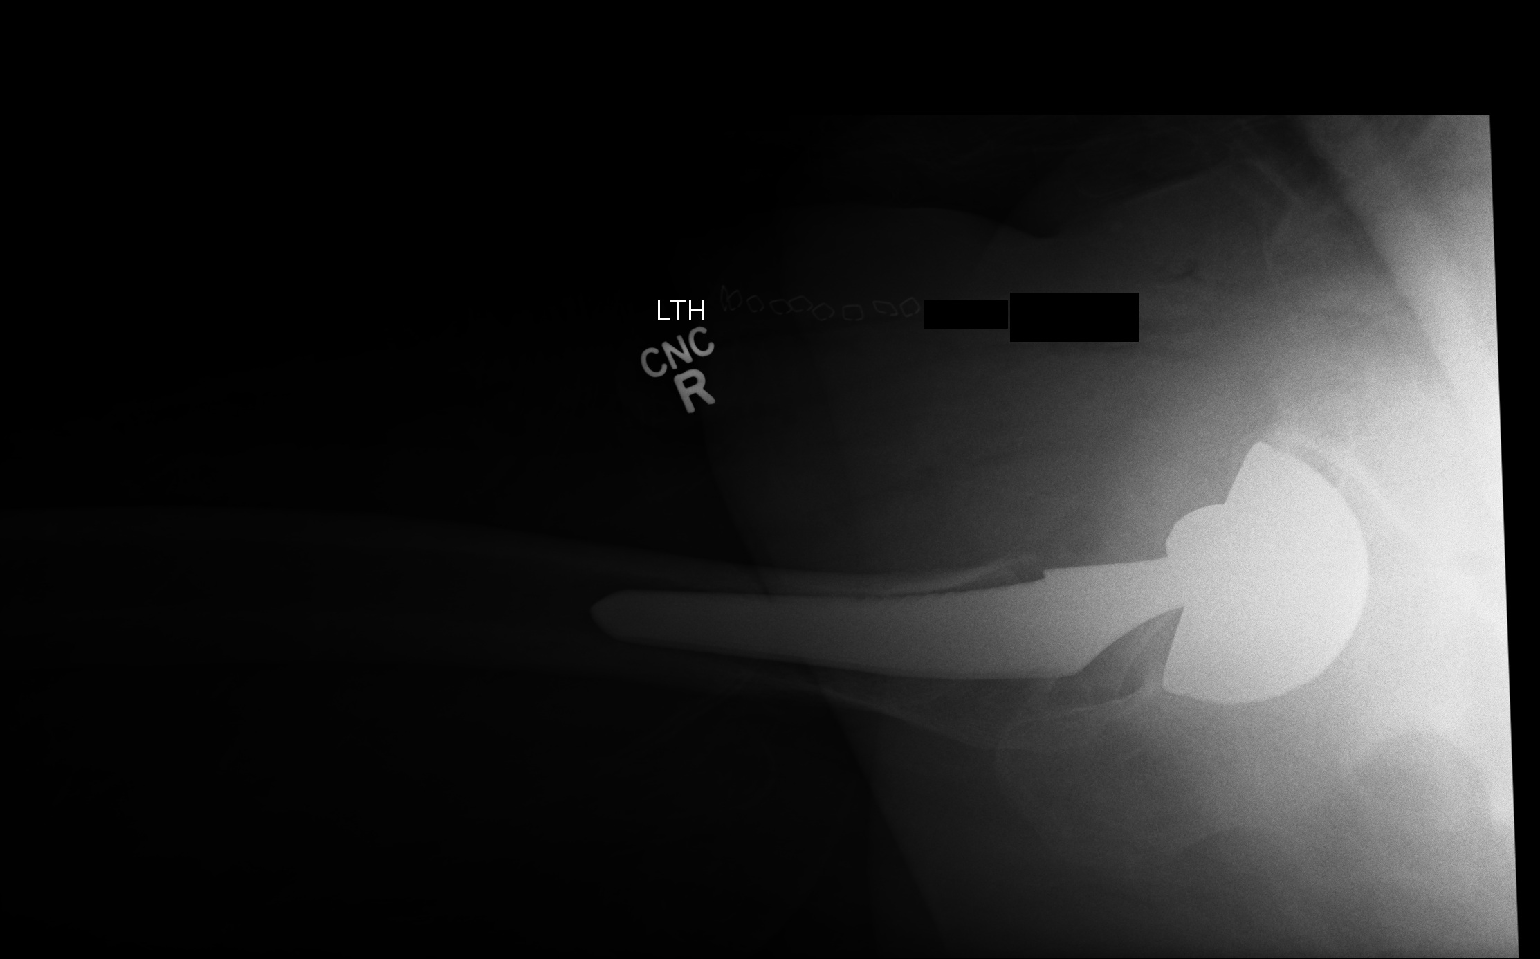

[2 of 2 positions shown; findings below may reference images not displayed]

FINDINGS: Two views of the right hip submitted. There is right hip prosthesis
with anatomic alignment. Postsurgical changes are noted with lateral
skin staples. Periarticular soft tissue air.
IMPRESSION: Right hip prosthesis with anatomic alignment. Postsurgical changes
are noted.
